# Patient Record
Sex: Female | Born: 1970 | Race: Black or African American | Hispanic: No | Smoking: Current every day smoker
Health system: Southern US, Community
[De-identification: ages and names within clinical notes are randomized; demographics above are authoritative.]

---

## 1999-05-20 ENCOUNTER — Ambulatory Visit (HOSPITAL_COMMUNITY): Admission: RE | Admit: 1999-05-20 | Discharge: 1999-05-20 | Payer: Self-pay | Admitting: *Deleted

## 1999-06-26 ENCOUNTER — Inpatient Hospital Stay (HOSPITAL_COMMUNITY): Admission: AD | Admit: 1999-06-26 | Discharge: 1999-06-26 | Payer: Self-pay | Admitting: Obstetrics

## 1999-09-05 ENCOUNTER — Encounter (INDEPENDENT_AMBULATORY_CARE_PROVIDER_SITE_OTHER): Payer: Self-pay | Admitting: Specialist

## 1999-09-05 ENCOUNTER — Inpatient Hospital Stay (HOSPITAL_COMMUNITY): Admission: AD | Admit: 1999-09-05 | Discharge: 1999-09-07 | Payer: Self-pay | Admitting: *Deleted

## 2000-01-03 ENCOUNTER — Encounter: Admission: RE | Admit: 2000-01-03 | Discharge: 2000-01-03 | Payer: Self-pay | Admitting: Obstetrics & Gynecology

## 2000-10-18 ENCOUNTER — Emergency Department (HOSPITAL_COMMUNITY): Admission: EM | Admit: 2000-10-18 | Discharge: 2000-10-18 | Payer: Self-pay | Admitting: Emergency Medicine

## 2001-05-29 ENCOUNTER — Emergency Department (HOSPITAL_COMMUNITY): Admission: EM | Admit: 2001-05-29 | Discharge: 2001-05-29 | Payer: Self-pay | Admitting: *Deleted

## 2001-06-06 ENCOUNTER — Encounter: Payer: Self-pay | Admitting: Emergency Medicine

## 2001-06-06 ENCOUNTER — Emergency Department (HOSPITAL_COMMUNITY): Admission: EM | Admit: 2001-06-06 | Discharge: 2001-06-06 | Payer: Self-pay | Admitting: Emergency Medicine

## 2001-09-29 ENCOUNTER — Encounter: Payer: Self-pay | Admitting: Emergency Medicine

## 2001-09-29 ENCOUNTER — Emergency Department (HOSPITAL_COMMUNITY): Admission: EM | Admit: 2001-09-29 | Discharge: 2001-09-29 | Payer: Self-pay | Admitting: Emergency Medicine

## 2001-12-01 ENCOUNTER — Emergency Department (HOSPITAL_COMMUNITY): Admission: EM | Admit: 2001-12-01 | Discharge: 2001-12-02 | Payer: Self-pay | Admitting: Emergency Medicine

## 2002-02-08 ENCOUNTER — Emergency Department (HOSPITAL_COMMUNITY): Admission: EM | Admit: 2002-02-08 | Discharge: 2002-02-08 | Payer: Self-pay | Admitting: Emergency Medicine

## 2002-05-02 ENCOUNTER — Emergency Department (HOSPITAL_COMMUNITY): Admission: EM | Admit: 2002-05-02 | Discharge: 2002-05-02 | Payer: Self-pay | Admitting: Emergency Medicine

## 2003-03-19 ENCOUNTER — Emergency Department (HOSPITAL_COMMUNITY): Admission: EM | Admit: 2003-03-19 | Discharge: 2003-03-19 | Payer: Self-pay | Admitting: Emergency Medicine

## 2003-10-29 ENCOUNTER — Ambulatory Visit (HOSPITAL_COMMUNITY): Admission: RE | Admit: 2003-10-29 | Discharge: 2003-10-29 | Payer: Self-pay | Admitting: Internal Medicine

## 2006-01-10 ENCOUNTER — Emergency Department (HOSPITAL_COMMUNITY): Admission: EM | Admit: 2006-01-10 | Discharge: 2006-01-10 | Payer: Self-pay | Admitting: Emergency Medicine

## 2006-04-11 ENCOUNTER — Emergency Department (HOSPITAL_COMMUNITY): Admission: EM | Admit: 2006-04-11 | Discharge: 2006-04-11 | Payer: Self-pay | Admitting: *Deleted

## 2006-04-14 ENCOUNTER — Emergency Department (HOSPITAL_COMMUNITY): Admission: AD | Admit: 2006-04-14 | Discharge: 2006-04-14 | Payer: Self-pay | Admitting: Family Medicine

## 2006-06-17 ENCOUNTER — Emergency Department (HOSPITAL_COMMUNITY): Admission: EM | Admit: 2006-06-17 | Discharge: 2006-06-17 | Payer: Self-pay | Admitting: *Deleted

## 2006-06-28 ENCOUNTER — Ambulatory Visit: Payer: Self-pay | Admitting: Internal Medicine

## 2006-07-05 ENCOUNTER — Emergency Department (HOSPITAL_COMMUNITY): Admission: EM | Admit: 2006-07-05 | Discharge: 2006-07-05 | Payer: Self-pay | Admitting: Family Medicine

## 2006-08-27 ENCOUNTER — Inpatient Hospital Stay (HOSPITAL_COMMUNITY): Admission: AD | Admit: 2006-08-27 | Discharge: 2006-08-30 | Payer: Self-pay | Admitting: Psychiatry

## 2006-08-27 ENCOUNTER — Ambulatory Visit: Payer: Self-pay | Admitting: Psychiatry

## 2010-07-21 ENCOUNTER — Emergency Department (HOSPITAL_COMMUNITY)
Admission: EM | Admit: 2010-07-21 | Discharge: 2010-07-21 | Payer: Self-pay | Source: Home / Self Care | Admitting: Emergency Medicine

## 2010-10-24 LAB — URINALYSIS, ROUTINE W REFLEX MICROSCOPIC
Bilirubin Urine: NEGATIVE
Nitrite: NEGATIVE
Urobilinogen, UA: 1 mg/dL (ref 0.0–1.0)

## 2010-10-24 LAB — URINE MICROSCOPIC-ADD ON

## 2010-12-30 NOTE — Op Note (Signed)
[facility]  Patient:    Jeanette Fields                          MRN: [id number] Proc. Date: 09/06/99 Adm. Date:  [id number] Attending:  Antionette Char CC:         Bernette Redbird, M.D.                           Operative Report  PREOPERATIVE DIAGNOSIS:       Desires sterilization.  POSTOPERATIVE DIAGNOSIS:      Desires sterilization.  OPERATION:                    Bilateral partial salpingectomy (Pomeroy technique).  SURGEON:                      Charles A. Clearance Coots, M.D.  ASSISTANT:                    Bernette Redbird, M.D.  ANESTHESIA:                   General anesthesia.  ESTIMATED BLOOD LOSS:         Negligible.  COMPLICATIONS:                None.  SPECIMEN:                     Approximately 2 cm segments of right and left fallopian tubes.  DESCRIPTION OF PROCEDURE:     The patient was brought to the operating room and  after satisfactory general endotracheal anesthesia, the abdomen was prepped and  draped in the usual sterile fashion in the supine position.  A small inferior umbilical incision was made with the scalpel that was deepened down to the fascia with curved Mayo scissors bluntly.  The fascia was grasped in the midline with wo Kelly forceps and was cut in between transversely with curved Mayo scissors along with the peritoneum.  The incisions were extended to the left and to the right ith curved Mayo scissors.  Army-Navy right angle retractors were placed in the incision and the omentum was moved superiorly and the right fallopian tube was identified and was grasped with the Babcock clamp and the tube was followed from the cornual end to the fimbrial end serially in the grasp of Babcock clamps.  The tube was hen followed in a retrograde fashion from the fimbrial end back to the isthmic area of the tube with the Babcock clamps.  A knuckle of tube beneath the Babcock clamp n the isthmic area of the tube was doubly  ligated with #1 plain catgut and the section of the tube above the knot was excised with Metzenbaum scissors and submitted to pathology for evaluation.  There was no active bleeding from the tubal stumps and it was therefore placed back in the normal anatomic position.  The same procedure was performed on the opposite side without complications.  The abdomen was then closed as follows.  The peritoneum and fascia was closed as one with a  continuous suture of 2-0 Vicryl.  Subcutaneous tissue was approximated with two  interrupted sutures of 2-0 Vicryl.  The skin was closed with a continuous subcuticular suture of 4-0 Monocryl.  A sterile bandage was applied to the incision closure.  The surgical technician indicated that all sponge, needle, and  instrument counts were correct.  The patient tolerated the procedure well and was transported to the recovery room in satisfactory condition. DD:  09/06/99 TD:  09/07/99 Job: [id number]

## 2010-12-30 NOTE — Discharge Summary (Signed)
NAMEHEMA, LANZA                ACCOUNT NO.:  [id number]   MEDICAL RECORD NO.:  [id number]          PATIENT TYPE:  IPS   LOCATION:  0303                          FACILITY:  [facility]   PHYSICIAN:  Anselm Jungling, MD  DATE OF BIRTH:  [birth year]   DATE OF ADMISSION:  08/27/2006  DATE OF DISCHARGE:  08/30/2006                               DISCHARGE SUMMARY   IDENTIFYING DATA AND REASON FOR ADMISSION:  This was an inpatient  psychiatric admission for Jeanette Fields, a 40 year old separated female who was  admitted due to increasing depression, agitation, anger, including  homicidal ideation towards her husband, from whom she had separated  several months earlier.  She was not currently involved in any  outpatient psychiatric treatment.  There were concerns about substance  abuse.  Please refer to the admission note for further details  pertaining to the symptoms, circumstances and history that led to her  hospitalization.  She was given an initial Axis I diagnosis of rule out  mood disorder NOS, and rule out polysubstance abuse.   MEDICAL AND LABORATORY:  The patient was medically and physically  assessed by the psychiatric nurse practitioner.  She was in good health  without any active or chronic medical problems.  There were no  significant medical issues.   HOSPITAL COURSE:  The patient was admitted to the adult inpatient  psychiatric service.  She presented as a mildly obese female who was  normally developed, healthy appearing, friendly, and pleasant.  She was  fully oriented.  There were no signs or symptoms of psychosis or thought  disorder.  She was calm and nonagitated.  She denied suicidal ideation,  but expressed some gratitude that she was in the inpatient situation,  stating that she had been very stressed out.  She verbalized a strong  desire for help.   She was involved in various therapeutic groups and activities including  therapeutic groups geared towards helping her to  develop more in the way  of an understanding of her underlying disorders and dynamics.   She consented to a trial of Zoloft 50 mg daily to address depressive  symptoms, and trazodone 50 mg at bedtime as needed for insomnia.   She was a good participant in the treatment program.  On the fourth  hospital day, there was a family session involving the patient and her  mother.  In that meeting she restated that she was not having any  suicidal ideation.  The patient's mother encouraged her to focus on her  children and stated to the patient that she had a strong support system  in place.  The mother stated that the patient must be willing to stop  drinking and using drugs.  They discussed the fact the patient had been  in an unhealthy marriage for years, and mother encouraged the patient to  move forward on her own.  Mother also encouraged the patient to find  employment and become more involved in positive activities.  The patient  stated that she was feeling focused on her children and that she planned  to follow  up with a psychiatrist and a therapist.  They discussed the  plan for the patient to live with her sister until she is able to find  employment.  They were given a pamphlet on suicide prevention and the  crisis hotline.  Following this, the patient was discharged.  She agreed  to the following aftercare plan.   AFTERCARE:  The patient was given telephone numbers for followup  appointments including numbers for [facility], [facility], and [facility].   DISCHARGE MEDICATIONS:  Zoloft 50 mg daily, and trazodone 50 mg h.s.  p.r.n. insomnia.   DISCHARGE DIAGNOSES:  AXIS I:  Major depressive episode and history of  substance abuse.  AXIS II: Deferred.  AXIS III: No acute or chronic illnesses.  AXIS IV: Stressors severe.  AXIS V: GAF on discharge 65.      Anselm Jungling, MD  Electronically Signed     SPB/MEDQ  D:   08/31/2006  T:  08/31/2006  Job:  [id number]
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
## 2015-07-21 ENCOUNTER — Encounter (HOSPITAL_COMMUNITY): Payer: Self-pay

## 2015-07-21 ENCOUNTER — Emergency Department (HOSPITAL_COMMUNITY): Payer: 59

## 2015-07-21 ENCOUNTER — Emergency Department (HOSPITAL_COMMUNITY)
Admission: EM | Admit: 2015-07-21 | Discharge: 2015-07-21 | Disposition: A | Discharge status: Home / Self Care | Payer: 59 | Attending: Emergency Medicine | Admitting: Emergency Medicine

## 2015-07-21 DIAGNOSIS — Y998 Other external cause status: Secondary | ICD-10-CM | POA: Insufficient documentation

## 2015-07-21 DIAGNOSIS — S86912A Strain of unspecified muscle(s) and tendon(s) at lower leg level, left leg, initial encounter: Secondary | ICD-10-CM | POA: Insufficient documentation

## 2015-07-21 DIAGNOSIS — F17200 Nicotine dependence, unspecified, uncomplicated: Secondary | ICD-10-CM | POA: Diagnosis not present

## 2015-07-21 DIAGNOSIS — Y9389 Activity, other specified: Secondary | ICD-10-CM | POA: Insufficient documentation

## 2015-07-21 DIAGNOSIS — S8392XA Sprain of unspecified site of left knee, initial encounter: Secondary | ICD-10-CM | POA: Diagnosis not present

## 2015-07-21 DIAGNOSIS — Y92410 Unspecified street and highway as the place of occurrence of the external cause: Secondary | ICD-10-CM | POA: Insufficient documentation

## 2015-07-21 DIAGNOSIS — S8992XA Unspecified injury of left lower leg, initial encounter: Secondary | ICD-10-CM | POA: Diagnosis present

## 2015-07-21 MED ORDER — KETOROLAC TROMETHAMINE 60 MG/2ML IM SOLN
60.0000 mg | Freq: Once | INTRAMUSCULAR | Status: AC
  Administered 2015-07-21: 60 mg via INTRAMUSCULAR
  Filled 2015-07-21: qty 2

## 2015-07-21 MED ORDER — TRAMADOL HCL 50 MG PO TABS: 50.0000 mg | ORAL_TABLET | Freq: Four times a day (QID) | ORAL | Status: AC | PRN

## 2015-07-21 MED ORDER — OXYCODONE-ACETAMINOPHEN 5-325 MG PO TABS
1.0000 | ORAL_TABLET | Freq: Once | ORAL | Status: AC
  Administered 2015-07-21: 1 via ORAL
  Filled 2015-07-21: qty 1
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
## 2015-07-21 NOTE — ED Notes (Signed)
MD at bedside. 

## 2015-07-21 NOTE — ED Notes (Signed)
Attempted to give pt discharge instructions. Pt refused to look at nurse. [place] Pt declined [place]
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
